# Patient Record
Sex: Female | Born: 1955 | Race: White | Hispanic: No | Marital: Married | Smoking: Never smoker
Health system: Southern US, Community
[De-identification: ages and names within clinical notes are randomized; demographics above are authoritative.]

## PROBLEM LIST (undated history)

## (undated) DIAGNOSIS — N6320 Unspecified lump in the left breast, unspecified quadrant: Secondary | ICD-10-CM

## (undated) DIAGNOSIS — F419 Anxiety disorder, unspecified: Secondary | ICD-10-CM

## (undated) DIAGNOSIS — E039 Hypothyroidism, unspecified: Secondary | ICD-10-CM

## (undated) DIAGNOSIS — K219 Gastro-esophageal reflux disease without esophagitis: Secondary | ICD-10-CM

## (undated) HISTORY — PX: WISDOM TOOTH EXTRACTION: SHX21

---

## 2013-12-04 ENCOUNTER — Other Ambulatory Visit: Payer: Self-pay | Admitting: Surgical Oncology

## 2013-12-04 DIAGNOSIS — N63 Unspecified lump in unspecified breast: Secondary | ICD-10-CM

## 2013-12-04 DIAGNOSIS — R921 Mammographic calcification found on diagnostic imaging of breast: Secondary | ICD-10-CM

## 2013-12-16 ENCOUNTER — Other Ambulatory Visit: Payer: Self-pay | Admitting: Surgical Oncology

## 2013-12-16 DIAGNOSIS — N63 Unspecified lump in unspecified breast: Secondary | ICD-10-CM

## 2013-12-17 ENCOUNTER — Ambulatory Visit
Admission: RE | Admit: 2013-12-17 | Discharge: 2013-12-17 | Disposition: A | Discharge status: Home / Self Care | Payer: BC Managed Care – PPO | Source: Ambulatory Visit | Attending: Surgical Oncology | Admitting: Surgical Oncology

## 2013-12-17 ENCOUNTER — Other Ambulatory Visit: Payer: Self-pay

## 2013-12-17 DIAGNOSIS — R921 Mammographic calcification found on diagnostic imaging of breast: Secondary | ICD-10-CM

## 2013-12-17 DIAGNOSIS — N63 Unspecified lump in unspecified breast: Secondary | ICD-10-CM

## 2014-01-22 ENCOUNTER — Encounter (HOSPITAL_COMMUNITY): Payer: Self-pay | Admitting: Emergency Medicine

## 2014-01-22 ENCOUNTER — Emergency Department (HOSPITAL_COMMUNITY)
Admission: EM | Admit: 2014-01-22 | Discharge: 2014-01-22 | Disposition: A | Discharge status: Home / Self Care | Payer: Worker's Compensation | Attending: Emergency Medicine | Admitting: Emergency Medicine

## 2014-01-22 DIAGNOSIS — S0990XA Unspecified injury of head, initial encounter: Secondary | ICD-10-CM | POA: Insufficient documentation

## 2014-01-22 DIAGNOSIS — W01198A Fall on same level from slipping, tripping and stumbling with subsequent striking against other object, initial encounter: Secondary | ICD-10-CM | POA: Insufficient documentation

## 2014-01-22 DIAGNOSIS — S060X0A Concussion without loss of consciousness, initial encounter: Secondary | ICD-10-CM

## 2014-01-22 DIAGNOSIS — Y9289 Other specified places as the place of occurrence of the external cause: Secondary | ICD-10-CM | POA: Insufficient documentation

## 2014-01-22 DIAGNOSIS — Y9389 Activity, other specified: Secondary | ICD-10-CM | POA: Insufficient documentation

## 2014-01-22 DIAGNOSIS — W19XXXA Unspecified fall, initial encounter: Secondary | ICD-10-CM

## 2014-01-22 MED ORDER — ACETAMINOPHEN 500 MG PO TABS
1000.0000 mg | ORAL_TABLET | Freq: Once | ORAL | Status: AC
  Administered 2014-01-22: 1000 mg via ORAL
  Filled 2014-01-22: qty 2

## 2014-01-22 NOTE — ED Notes (Signed)
Patient coming from work after falling on a slick surface that was being mopped by facility.  Pt states she hit the left side of her head against the wall causing a headache and some numbness and left elbow pain.  Denies LOC.  Patient has bruising to the left elbow but is able to move freely.

## 2014-01-22 NOTE — ED Notes (Signed)
Patient alert and oriented at discharge.  Patient ambulatory to the waiting room with this RN to waiting family.

## 2014-01-22 NOTE — Discharge Instructions (Signed)
Concussion  A concussion, or closed-head injury, is a brain injury caused by a direct blow to the head or by a quick and sudden movement (jolt) of the head or neck. Concussions are usually not life-threatening. Even so, the effects of a concussion can be serious. If you have had a concussion before, you are more likely to experience concussion-like symptoms after a direct blow to the head.   CAUSES  · Direct blow to the head, such as from running into another player during a soccer game, being hit in a fight, or hitting your head on a hard surface.  · A jolt of the head or neck that causes the brain to move back and forth inside the skull, such as in a car crash.  SIGNS AND SYMPTOMS  The signs of a concussion can be hard to notice. Early on, they may be missed by you, family members, and health care providers. You may look fine but act or feel differently.  Symptoms are usually temporary, but they may last for days, weeks, or even longer. Some symptoms may appear right away while others may not show up for hours or days. Every head injury is different. Symptoms include:  · Mild to moderate headaches that will not go away.  · A feeling of pressure inside your head.  · Having more trouble than usual:  ¨ Learning or remembering things you have heard.  ¨ Answering questions.  ¨ Paying attention or concentrating.  ¨ Organizing daily tasks.  ¨ Making decisions and solving problems.  · Slowness in thinking, acting or reacting, speaking, or reading.  · Getting lost or being easily confused.  · Feeling tired all the time or lacking energy (fatigued).  · Feeling drowsy.  · Sleep disturbances.  ¨ Sleeping more than usual.  ¨ Sleeping less than usual.  ¨ Trouble falling asleep.  ¨ Trouble sleeping (insomnia).  · Loss of balance or feeling lightheaded or dizzy.  · Nausea or vomiting.  · Numbness or tingling.  · Increased sensitivity to:  ¨ Sounds.  ¨ Lights.  ¨ Distractions.  · Vision problems or eyes that tire  easily.  · Diminished sense of taste or smell.  · Ringing in the ears.  · Mood changes such as feeling sad or anxious.  · Becoming easily irritated or angry for little or no reason.  · Lack of motivation.  · Seeing or hearing things other people do not see or hear (hallucinations).  DIAGNOSIS  Your health care provider can usually diagnose a concussion based on a description of your injury and symptoms. He or she will ask whether you passed out (lost consciousness) and whether you are having trouble remembering events that happened right before and during your injury.  Your evaluation might include:  · A brain scan to look for signs of injury to the brain. Even if the test shows no injury, you may still have a concussion.  · Blood tests to be sure other problems are not present.  TREATMENT  · Concussions are usually treated in an emergency department, in urgent care, or at a clinic. You may need to stay in the hospital overnight for further treatment.  · Tell your health care provider if you are taking any medicines, including prescription medicines, over-the-counter medicines, and natural remedies. Some medicines, such as blood thinners (anticoagulants) and aspirin, may increase the chance of complications. Also tell your health care provider whether you have had alcohol or are taking illegal drugs. This information   may affect treatment.  · Your health care provider will send you home with important instructions to follow.  · How fast you will recover from a concussion depends on many factors. These factors include how severe your concussion is, what part of your brain was injured, your age, and how healthy you were before the concussion.  · Most people with mild injuries recover fully. Recovery can take time. In general, recovery is slower in older persons. Also, persons who have had a concussion in the past or have other medical problems may find that it takes longer to recover from their current injury.  HOME  CARE INSTRUCTIONS  General Instructions  · Carefully follow the directions your health care provider gave you.  · Only take over-the-counter or prescription medicines for pain, discomfort, or fever as directed by your health care provider.  · Take only those medicines that your health care provider has approved.  · Do not drink alcohol until your health care provider says you are well enough to do so. Alcohol and certain other drugs may slow your recovery and can put you at risk of further injury.  · If it is harder than usual to remember things, write them down.  · If you are easily distracted, try to do one thing at a time. For example, do not try to watch TV while fixing dinner.  · Talk with family members or close friends when making important decisions.  · Keep all follow-up appointments. Repeated evaluation of your symptoms is recommended for your recovery.  · Watch your symptoms and tell others to do the same. Complications sometimes occur after a concussion. Older adults with a brain injury may have a higher risk of serious complications, such as a blood clot on the brain.  · Tell your teachers, school nurse, school counselor, coach, athletic trainer, or work manager about your injury, symptoms, and restrictions. Tell them about what you can or cannot do. They should watch for:  ¨ Increased problems with attention or concentration.  ¨ Increased difficulty remembering or learning new information.  ¨ Increased time needed to complete tasks or assignments.  ¨ Increased irritability or decreased ability to cope with stress.  ¨ Increased symptoms.  · Rest. Rest helps the brain to heal. Make sure you:  ¨ Get plenty of sleep at night. Avoid staying up late at night.  ¨ Keep the same bedtime hours on weekends and weekdays.  ¨ Rest during the day. Take daytime naps or rest breaks when you feel tired.  · Limit activities that require a lot of thought or concentration. These include:  ¨ Doing homework or job-related  work.  ¨ Watching TV.  ¨ Working on the computer.  · Avoid any situation where there is potential for another head injury (football, hockey, soccer, basketball, martial arts, downhill snow sports and horseback riding). Your condition will get worse every time you experience a concussion. You should avoid these activities until you are evaluated by the appropriate follow-up health care providers.  Returning To Your Regular Activities  You will need to return to your normal activities slowly, not all at once. You must give your body and brain enough time for recovery.  · Do not return to sports or other athletic activities until your health care provider tells you it is safe to do so.  · Ask your health care provider when you can drive, ride a bicycle, or operate heavy machinery. Your ability to react may be slower after a   brain injury. Never do these activities if you are dizzy.  · Ask your health care provider about when you can return to work or school.  Preventing Another Concussion  It is very important to avoid another brain injury, especially before you have recovered. In rare cases, another injury can lead to permanent brain damage, brain swelling, or death. The risk of this is greatest during the first 7-10 days after a head injury. Avoid injuries by:  · Wearing a seat belt when riding in a car.  · Drinking alcohol only in moderation.  · Wearing a helmet when biking, skiing, skateboarding, skating, or doing similar activities.  · Avoiding activities that could lead to a second concussion, such as contact or recreational sports, until your health care provider says it is okay.  · Taking safety measures in your home.  ¨ Remove clutter and tripping hazards from floors and stairways.  ¨ Use grab bars in bathrooms and handrails by stairs.  ¨ Place non-slip mats on floors and in bathtubs.  ¨ Improve lighting in dim areas.  SEEK MEDICAL CARE IF:  · You have increased problems paying attention or  concentrating.  · You have increased difficulty remembering or learning new information.  · You need more time to complete tasks or assignments than before.  · You have increased irritability or decreased ability to cope with stress.  · You have more symptoms than before.  Seek medical care if you have any of the following symptoms for more than 2 weeks after your injury:  · Lasting (chronic) headaches.  · Dizziness or balance problems.  · Nausea.  · Vision problems.  · Increased sensitivity to noise or light.  · Depression or mood swings.  · Anxiety or irritability.  · Memory problems.  · Difficulty concentrating or paying attention.  · Sleep problems.  · Feeling tired all the time.  SEEK IMMEDIATE MEDICAL CARE IF:  · You have severe or worsening headaches. These may be a sign of a blood clot in the brain.  · You have weakness (even if only in one hand, leg, or part of the face).  · You have numbness.  · You have decreased coordination.  · You vomit repeatedly.  · You have increased sleepiness.  · One pupil is larger than the other.  · You have convulsions.  · You have slurred speech.  · You have increased confusion. This may be a sign of a blood clot in the brain.  · You have increased restlessness, agitation, or irritability.  · You are unable to recognize people or places.  · You have neck pain.  · It is difficult to wake you up.  · You have unusual behavior changes.  · You lose consciousness.  MAKE SURE YOU:  · Understand these instructions.  · Will watch your condition.  · Will get help right away if you are not doing well or get worse.  Document Released: 05/28/2003 Document Revised: 03/12/2013 Document Reviewed: 09/27/2012  [employer]® Patient Information ©2015 [employer]. This information is not intended to replace advice given to you by your health care provider. Make sure you discuss any questions you have with your health care provider.

## 2014-01-22 NOTE — ED Provider Notes (Signed)
CSN: [id number]     Arrival date & time 01/22/14  0615 History   First MD Initiated Contact with Patient 01/22/14 [phone]     No chief complaint on file.    (Consider location/radiation/quality/duration/timing/severity/associated sxs/prior Treatment) HPI Comments: Patient presents to the emergency department with chief complaint of fall. She states that she slipped on wet tile floor this morning, causing her to fall backward. She states that she hit her head on the wall. She denies any LOC. Denies any weakness, vision changes, numbness, tingling, or vomiting. She has not taken anything to alleviate the symptoms. Fall occurred at approximately 5:30. She states that she feels a little "fuzzy." She states that she suffers from anxiety, and this is made her even more anxious. She tried taking an Ativan earlier this morning for the anxiety.  The history is provided by the patient. No language interpreter was used.    No past medical history on file. No past surgical history on file. No family history on file. History  Substance Use Topics  . Smoking status: Not on file  . Smokeless tobacco: Not on file  . Alcohol Use: Not on file   OB History    No data available     Review of Systems  Constitutional: Negative for fever and chills.  Respiratory: Negative for shortness of breath.   Cardiovascular: Negative for chest pain.  Gastrointestinal: Negative for nausea, vomiting, diarrhea and constipation.  Genitourinary: Negative for dysuria.  Neurological: Positive for headaches.  All other systems reviewed and are negative.     Allergies  Review of patient's allergies indicates no known allergies.  Home Medications   Prior to Admission medications   Not on File   BP 117/104 mmHg  Pulse 85  Temp(Src) 97.9 F (36.6 C) (Oral)  Resp 28  SpO2 100% Physical Exam  Constitutional: She is oriented to person, place, and time. She appears well-developed and well-nourished.  HENT:  Head:  Normocephalic and atraumatic.  Right Ear: External ear normal.  Left Ear: External ear normal.  No Battle's sign, no raccoon eyes, no scalp hematoma  Eyes: Conjunctivae and EOM are normal. Pupils are equal, round, and reactive to light.  Neck: Normal range of motion. Neck supple.  No pain with neck flexion, no meningismus  Cardiovascular: Normal rate, regular rhythm and normal heart sounds.  Exam reveals no gallop and no friction rub.   No murmur heard. Pulmonary/Chest: Effort normal and breath sounds normal. No respiratory distress. She has no wheezes. She has no rales. She exhibits no tenderness.  Abdominal: Soft. She exhibits no distension and no mass. There is no tenderness. There is no rebound and no guarding.  Musculoskeletal: Normal range of motion. She exhibits no edema or tenderness.  Normal gait.  Neurological: She is alert and oriented to person, place, and time. She has normal reflexes.  CN 3-12 intact,speech is clear, movements are goal oriented, normal finger to nose, no pronator drift, sensation and strength intact bilaterally.  Skin: Skin is warm and dry.  Psychiatric: She has a normal mood and affect. Her behavior is normal. Judgment and thought content normal.  Nursing note and vitals reviewed.   ED Course  Procedures (including critical care time) Labs Review Labs Reviewed - No data to display  Imaging Review No results found.   EKG Interpretation None      MDM   Final diagnoses:  Fall, initial encounter  Head injury, initial encounter  Concussion, without loss of consciousness, initial encounter  Patient with mechanical fall. She struck her head on the wall. There are no signs of severe mechanism of injury, or trauma. She complains of slight headache, and slight fuzzy headed feeling. She denies any vomiting. Currently she clears [place] head CT rules. I will observe her for one hour to ensure that there is no vomiting, and that her GCS remains at 15.  Will treat with Tylenol. Will fluid challenge. Patient is neurovascularly intact.  7:27 AM Patient reassessed. She states that she still has slight headache, but otherwise feels well. Fluid challenge, and ambulate. Plan for discharge to home. Neuro exam repeated, patient is neurovascularly intact.    Roxy Horsemanobert Chancy Smigiel, PA-C 01/22/14 [phone]  Dione Boozeavid Glick, MD 01/22/14 [phone]

## 2014-01-22 NOTE — ED Notes (Signed)
Patient is drinking  A sprite.  Stated she was fine just a headache.

## 2016-01-06 ENCOUNTER — Other Ambulatory Visit: Payer: Self-pay | Admitting: Internal Medicine

## 2016-01-06 DIAGNOSIS — N6489 Other specified disorders of breast: Secondary | ICD-10-CM

## 2016-01-15 ENCOUNTER — Ambulatory Visit
Admission: RE | Admit: 2016-01-15 | Discharge: 2016-01-15 | Disposition: A | Discharge status: Home / Self Care | Payer: 59 | Source: Ambulatory Visit | Attending: Internal Medicine | Admitting: Internal Medicine

## 2016-01-15 DIAGNOSIS — N6489 Other specified disorders of breast: Secondary | ICD-10-CM

## 2016-02-09 ENCOUNTER — Ambulatory Visit: Payer: Self-pay | Admitting: General Surgery

## 2016-02-09 ENCOUNTER — Other Ambulatory Visit: Payer: Self-pay | Admitting: General Surgery

## 2016-02-09 DIAGNOSIS — N6022 Fibroadenosis of left breast: Secondary | ICD-10-CM

## 2016-02-19 ENCOUNTER — Ambulatory Visit: Payer: Self-pay | Admitting: General Surgery

## 2016-02-19 DIAGNOSIS — N6022 Fibroadenosis of left breast: Secondary | ICD-10-CM

## 2016-02-29 ENCOUNTER — Encounter (HOSPITAL_BASED_OUTPATIENT_CLINIC_OR_DEPARTMENT_OTHER): Payer: Self-pay | Admitting: *Deleted

## 2016-03-02 ENCOUNTER — Ambulatory Visit
Admission: RE | Admit: 2016-03-02 | Discharge: 2016-03-02 | Disposition: A | Discharge status: Home / Self Care | Payer: 59 | Source: Ambulatory Visit | Attending: General Surgery | Admitting: General Surgery

## 2016-03-02 DIAGNOSIS — N6022 Fibroadenosis of left breast: Secondary | ICD-10-CM

## 2016-03-02 NOTE — Progress Notes (Signed)
Boost drink given to pt. With instructions to have completed by 0430 hrs.  NPO otherwise after midnight. Pt verbalized understanding.

## 2016-03-04 ENCOUNTER — Ambulatory Visit (HOSPITAL_BASED_OUTPATIENT_CLINIC_OR_DEPARTMENT_OTHER): Payer: 59 | Admitting: Anesthesiology

## 2016-03-04 ENCOUNTER — Encounter (HOSPITAL_BASED_OUTPATIENT_CLINIC_OR_DEPARTMENT_OTHER)
Admission: RE | Disposition: A | Discharge status: Home / Self Care | Payer: Self-pay | Source: Ambulatory Visit | Attending: General Surgery

## 2016-03-04 ENCOUNTER — Ambulatory Visit
Admission: RE | Admit: 2016-03-04 | Discharge: 2016-03-04 | Disposition: A | Discharge status: Home / Self Care | Payer: 59 | Source: Ambulatory Visit | Attending: General Surgery | Admitting: General Surgery

## 2016-03-04 ENCOUNTER — Ambulatory Visit (HOSPITAL_BASED_OUTPATIENT_CLINIC_OR_DEPARTMENT_OTHER)
Admission: RE | Admit: 2016-03-04 | Discharge: 2016-03-04 | Disposition: A | Discharge status: Home / Self Care | Payer: 59 | Source: Ambulatory Visit | Attending: General Surgery | Admitting: General Surgery

## 2016-03-04 ENCOUNTER — Encounter (HOSPITAL_BASED_OUTPATIENT_CLINIC_OR_DEPARTMENT_OTHER): Payer: Self-pay | Admitting: *Deleted

## 2016-03-04 DIAGNOSIS — Z79899 Other long term (current) drug therapy: Secondary | ICD-10-CM | POA: Insufficient documentation

## 2016-03-04 DIAGNOSIS — N6489 Other specified disorders of breast: Secondary | ICD-10-CM | POA: Diagnosis present

## 2016-03-04 DIAGNOSIS — E039 Hypothyroidism, unspecified: Secondary | ICD-10-CM | POA: Diagnosis not present

## 2016-03-04 DIAGNOSIS — K219 Gastro-esophageal reflux disease without esophagitis: Secondary | ICD-10-CM | POA: Insufficient documentation

## 2016-03-04 DIAGNOSIS — N6022 Fibroadenosis of left breast: Secondary | ICD-10-CM

## 2016-03-04 DIAGNOSIS — Z803 Family history of malignant neoplasm of breast: Secondary | ICD-10-CM | POA: Diagnosis not present

## 2016-03-04 HISTORY — DX: Gastro-esophageal reflux disease without esophagitis: K21.9

## 2016-03-04 HISTORY — PX: BREAST LUMPECTOMY WITH RADIOACTIVE SEED LOCALIZATION: SHX6424

## 2016-03-04 HISTORY — DX: Anxiety disorder, unspecified: F41.9

## 2016-03-04 HISTORY — DX: Unspecified lump in the left breast, unspecified quadrant: N63.20

## 2016-03-04 HISTORY — DX: Hypothyroidism, unspecified: E03.9

## 2016-03-04 SURGERY — BREAST LUMPECTOMY WITH RADIOACTIVE SEED LOCALIZATION
Anesthesia: General | Site: Breast | Laterality: Left

## 2016-03-04 MED ORDER — OXYCODONE HCL 5 MG/5ML PO SOLN: 5.0000 mg | Freq: Once | ORAL | Status: DC | PRN

## 2016-03-04 MED ORDER — MIDAZOLAM HCL 2 MG/2ML IJ SOLN
INTRAMUSCULAR | Status: AC
  Filled 2016-03-04: qty 2

## 2016-03-04 MED ORDER — MEPERIDINE HCL 25 MG/ML IJ SOLN: 6.2500 mg | INTRAMUSCULAR | Status: DC | PRN

## 2016-03-04 MED ORDER — SCOPOLAMINE 1 MG/3DAYS TD PT72
1.0000 | MEDICATED_PATCH | Freq: Once | TRANSDERMAL | Status: AC | PRN
  Administered 2016-03-04: 1 via TRANSDERMAL

## 2016-03-04 MED ORDER — BUPIVACAINE-EPINEPHRINE (PF) 0.25% -1:200000 IJ SOLN
INTRAMUSCULAR | Status: AC
  Filled 2016-03-04: qty 30

## 2016-03-04 MED ORDER — CHLORHEXIDINE GLUCONATE CLOTH 2 % EX PADS: 6.0000 | MEDICATED_PAD | Freq: Once | CUTANEOUS | Status: DC

## 2016-03-04 MED ORDER — PROPOFOL 10 MG/ML IV BOLUS
INTRAVENOUS | Status: DC | PRN
  Administered 2016-03-04: 150 mg via INTRAVENOUS
  Administered 2016-03-04: 20 mg via INTRAVENOUS

## 2016-03-04 MED ORDER — MIDAZOLAM HCL 2 MG/2ML IJ SOLN
1.0000 mg | INTRAMUSCULAR | Status: DC | PRN
  Administered 2016-03-04: 2 mg via INTRAVENOUS

## 2016-03-04 MED ORDER — FENTANYL CITRATE (PF) 100 MCG/2ML IJ SOLN
INTRAMUSCULAR | Status: AC
  Filled 2016-03-04: qty 2

## 2016-03-04 MED ORDER — BUPIVACAINE HCL (PF) 0.25 % IJ SOLN
INTRAMUSCULAR | Status: AC
  Filled 2016-03-04: qty 30

## 2016-03-04 MED ORDER — CEFAZOLIN SODIUM-DEXTROSE 2-4 GM/100ML-% IV SOLN: 2.0000 g | INTRAVENOUS | Status: DC

## 2016-03-04 MED ORDER — LIDOCAINE 2% (20 MG/ML) 5 ML SYRINGE
INTRAMUSCULAR | Status: AC
  Filled 2016-03-04: qty 5

## 2016-03-04 MED ORDER — DEXAMETHASONE SODIUM PHOSPHATE 10 MG/ML IJ SOLN
INTRAMUSCULAR | Status: AC
  Filled 2016-03-04: qty 1

## 2016-03-04 MED ORDER — LACTATED RINGERS IV SOLN
INTRAVENOUS | Status: DC
  Administered 2016-03-04: 08:00:00 via INTRAVENOUS
  Administered 2016-03-04: 10 mL/h via INTRAVENOUS

## 2016-03-04 MED ORDER — ONDANSETRON HCL 4 MG/2ML IJ SOLN
INTRAMUSCULAR | Status: AC
  Filled 2016-03-04: qty 2

## 2016-03-04 MED ORDER — PROMETHAZINE HCL 25 MG/ML IJ SOLN: 6.2500 mg | INTRAMUSCULAR | Status: DC | PRN

## 2016-03-04 MED ORDER — HYDROCODONE-ACETAMINOPHEN 5-325 MG PO TABS: 1.0000 | ORAL_TABLET | ORAL | 0 refills | Status: AC | PRN

## 2016-03-04 MED ORDER — BUPIVACAINE HCL (PF) 0.25 % IJ SOLN
INTRAMUSCULAR | Status: DC | PRN
  Administered 2016-03-04: 20 mL

## 2016-03-04 MED ORDER — FENTANYL CITRATE (PF) 100 MCG/2ML IJ SOLN
50.0000 ug | INTRAMUSCULAR | Status: DC | PRN
  Administered 2016-03-04: 50 ug via INTRAVENOUS

## 2016-03-04 MED ORDER — OXYCODONE HCL 5 MG PO TABS: 5.0000 mg | ORAL_TABLET | Freq: Once | ORAL | Status: DC | PRN

## 2016-03-04 MED ORDER — CEFAZOLIN SODIUM-DEXTROSE 2-4 GM/100ML-% IV SOLN
INTRAVENOUS | Status: AC
  Filled 2016-03-04: qty 100

## 2016-03-04 MED ORDER — ONDANSETRON 4 MG PO TBDP
4.0000 mg | ORAL_TABLET | Freq: Once | ORAL | Status: AC
  Administered 2016-03-04: 4 mg via ORAL

## 2016-03-04 MED ORDER — CEFAZOLIN SODIUM-DEXTROSE 2-4 GM/100ML-% IV SOLN
2.0000 g | INTRAVENOUS | Status: AC
  Administered 2016-03-04: 2 g via INTRAVENOUS

## 2016-03-04 MED ORDER — ONDANSETRON 4 MG PO TBDP
ORAL_TABLET | ORAL | Status: AC
  Filled 2016-03-04: qty 1

## 2016-03-04 MED ORDER — HYDROMORPHONE HCL 1 MG/ML IJ SOLN: 0.2500 mg | INTRAMUSCULAR | Status: DC | PRN

## 2016-03-04 MED ORDER — LIDOCAINE 2% (20 MG/ML) 5 ML SYRINGE
INTRAMUSCULAR | Status: DC | PRN
  Administered 2016-03-04: 100 mg via INTRAVENOUS

## 2016-03-04 MED ORDER — DEXAMETHASONE SODIUM PHOSPHATE 4 MG/ML IJ SOLN
INTRAMUSCULAR | Status: DC | PRN
  Administered 2016-03-04: 10 mg via INTRAVENOUS

## 2016-03-04 MED ORDER — SCOPOLAMINE 1 MG/3DAYS TD PT72
MEDICATED_PATCH | TRANSDERMAL | Status: AC
  Filled 2016-03-04: qty 1

## 2016-03-04 MED ORDER — EPHEDRINE SULFATE-NACL 50-0.9 MG/10ML-% IV SOSY
PREFILLED_SYRINGE | INTRAVENOUS | Status: DC | PRN
  Administered 2016-03-04 (×2): 10 mg via INTRAVENOUS

## 2016-03-04 SURGICAL SUPPLY — 42 items
APPLIER CLIP 9.375 MED OPEN (MISCELLANEOUS)
BLADE SURG 15 STRL LF DISP TIS (BLADE) ×1 IMPLANT
BLADE SURG 15 STRL SS (BLADE) ×1
CANISTER SUC SOCK COL 7IN (MISCELLANEOUS) IMPLANT
CANISTER SUCT 1200ML W/VALVE (MISCELLANEOUS) ×2 IMPLANT
CHLORAPREP W/TINT 26ML (MISCELLANEOUS) ×2 IMPLANT
CLIP APPLIE 9.375 MED OPEN (MISCELLANEOUS) IMPLANT
COVER BACK TABLE 60X90IN (DRAPES) ×2 IMPLANT
COVER MAYO STAND STRL (DRAPES) ×2 IMPLANT
COVER PROBE W GEL 5X96 (DRAPES) ×2 IMPLANT
DECANTER SPIKE VIAL GLASS SM (MISCELLANEOUS) IMPLANT
DERMABOND ADVANCED (GAUZE/BANDAGES/DRESSINGS) ×1
DERMABOND ADVANCED .7 DNX12 (GAUZE/BANDAGES/DRESSINGS) ×1 IMPLANT
DEVICE DUBIN W/COMP PLATE 8390 (MISCELLANEOUS) ×2 IMPLANT
DRAPE LAPAROSCOPIC ABDOMINAL (DRAPES) ×2 IMPLANT
DRAPE UTILITY XL STRL (DRAPES) ×2 IMPLANT
ELECT COATED BLADE 2.86 ST (ELECTRODE) ×2 IMPLANT
ELECT REM PT RETURN 9FT ADLT (ELECTROSURGICAL) ×2
ELECTRODE REM PT RTRN 9FT ADLT (ELECTROSURGICAL) ×1 IMPLANT
GLOVE BIO SURGEON STRL SZ7.5 (GLOVE) ×4 IMPLANT
GLOVE BIOGEL PI IND STRL 7.0 (GLOVE) ×1 IMPLANT
GLOVE BIOGEL PI INDICATOR 7.0 (GLOVE) ×1
GLOVE SURG SS PI 6.5 STRL IVOR (GLOVE) ×2 IMPLANT
GOWN STRL REUS W/ TWL LRG LVL3 (GOWN DISPOSABLE) ×2 IMPLANT
GOWN STRL REUS W/TWL LRG LVL3 (GOWN DISPOSABLE) ×2
ILLUMINATOR WAVEGUIDE N/F (MISCELLANEOUS) IMPLANT
KIT MARKER MARGIN INK (KITS) ×2 IMPLANT
LIGHT WAVEGUIDE WIDE FLAT (MISCELLANEOUS) ×2 IMPLANT
NEEDLE HYPO 25X1 1.5 SAFETY (NEEDLE) ×2 IMPLANT
NS IRRIG 1000ML POUR BTL (IV SOLUTION) ×2 IMPLANT
PACK BASIN DAY SURGERY FS (CUSTOM PROCEDURE TRAY) ×2 IMPLANT
PENCIL BUTTON HOLSTER BLD 10FT (ELECTRODE) ×2 IMPLANT
SLEEVE SCD COMPRESS KNEE MED (MISCELLANEOUS) ×2 IMPLANT
SPONGE LAP 18X18 X RAY DECT (DISPOSABLE) ×2 IMPLANT
SUT MON AB 4-0 PC3 18 (SUTURE) ×2 IMPLANT
SUT SILK 2 0 SH (SUTURE) IMPLANT
SUT VICRYL 3-0 CR8 SH (SUTURE) ×2 IMPLANT
SYR CONTROL 10ML LL (SYRINGE) ×2 IMPLANT
TOWEL OR 17X24 6PK STRL BLUE (TOWEL DISPOSABLE) ×2 IMPLANT
TOWEL OR NON WOVEN STRL DISP B (DISPOSABLE) IMPLANT
TUBE CONNECTING 20X1/4 (TUBING) ×2 IMPLANT
YANKAUER SUCT BULB TIP NO VENT (SUCTIONS) ×2 IMPLANT

## 2016-03-04 NOTE — Interval H&P Note (Signed)
History and Physical Interval Note:  03/04/2016 7:26 AM  Joann Murphy  has presented today for surgery, with the diagnosis of LEFT BREAST COMPLEX SCLEROSING LESION  The various methods of treatment have been discussed with the patient and family. After consideration of risks, benefits and other options for treatment, the patient has consented to  Procedure(s): LEFT BREAST LUMPECTOMY WITH RADIOACTIVE SEED LOCALIZATION (Left) as a surgical intervention .  The patient's history has been reviewed, patient examined, no change in status, stable for surgery.  I have reviewed the patient's chart and labs.  Questions were answered to the patient's satisfaction.     TOTH III,PAUL S

## 2016-03-04 NOTE — Anesthesia Procedure Notes (Signed)
Procedure Name: LMA Insertion Date/Time: 03/04/2016 7:46 AM Performed by: Gar GibbonKEETON, Rayyan Orsborn S Pre-anesthesia Checklist: Patient identified, Emergency Drugs available, Suction available and Patient being monitored Patient Re-evaluated:Patient Re-evaluated prior to inductionOxygen Delivery Method: Circle system utilized Preoxygenation: Pre-oxygenation with 100% oxygen Intubation Type: IV induction Ventilation: Mask ventilation without difficulty LMA: LMA inserted LMA Size: 3.0 Number of attempts: 1 Airway Equipment and Method: Bite block Placement Confirmation: positive ETCO2 Tube secured with: Tape Dental Injury: Teeth and Oropharynx as per pre-operative assessment

## 2016-03-04 NOTE — Anesthesia Postprocedure Evaluation (Signed)
Anesthesia Post Note  Patient: Joann MunroRebecca Murphy  Procedure(s) Performed: Procedure(s) (LRB): LEFT BREAST LUMPECTOMY WITH RADIOACTIVE SEED LOCALIZATION (Left)  Patient location during evaluation: PACU Anesthesia Type: General Level of consciousness: sedated and patient cooperative Pain management: pain level controlled Vital Signs Assessment: post-procedure vital signs reviewed and stable Respiratory status: spontaneous breathing Cardiovascular status: stable Anesthetic complications: no    Last Vitals:  Vitals:   03/04/16 0915 03/04/16 0930  BP: 103/69 101/63  Pulse: 76 83  Resp: 11 17  Temp:      Last Pain:  Vitals:   03/04/16 0915  TempSrc:   PainSc: 0-No pain                 Lewie LoronJohn Darrelyn Morro

## 2016-03-04 NOTE — Anesthesia Preprocedure Evaluation (Signed)
Anesthesia Evaluation  Patient identified by MRN, date of birth, ID band Patient awake    Reviewed: Allergy & Precautions, NPO status , Patient's Chart, lab work & pertinent test results  Airway Mallampati: II  TM Distance: >3 FB Neck ROM: Full    Dental no notable dental hx.    Pulmonary neg pulmonary ROS,    Pulmonary exam normal breath sounds clear to auscultation       Cardiovascular negative cardio ROS Normal cardiovascular exam Rhythm:Regular Rate:Normal     Neuro/Psych negative neurological ROS  negative psych ROS   GI/Hepatic Neg liver ROS, GERD  ,  Endo/Other  Hypothyroidism   Renal/GU negative Renal ROS     Musculoskeletal negative musculoskeletal ROS (+)   Abdominal   Peds  Hematology negative hematology ROS (+)   Anesthesia Other Findings   Reproductive/Obstetrics negative OB ROS                             Anesthesia Physical Anesthesia Plan  ASA: II  Anesthesia Plan: General   Post-op Pain Management:    Induction: Intravenous  Airway Management Planned: LMA  Additional Equipment:   Intra-op Plan:   Post-operative Plan: Extubation in OR  Informed Consent: I have reviewed the patients History and Physical, chart, labs and discussed the procedure including the risks, benefits and alternatives for the proposed anesthesia with the patient or authorized representative who has indicated his/her understanding and acceptance.   Dental advisory given  Plan Discussed with: CRNA  Anesthesia Plan Comments:         Anesthesia Quick Evaluation

## 2016-03-04 NOTE — Op Note (Signed)
03/04/2016  8:39 AM  PATIENT:  Joann Murphy  60 y.o. female  PRE-OPERATIVE DIAGNOSIS:  LEFT BREAST COMPLEX SCLEROSING LESION  POST-OPERATIVE DIAGNOSIS:  LEFT BREAST COMPLEX SCLEROSING LESION  PROCEDURE:  Procedure(s): LEFT BREAST LUMPECTOMY WITH RADIOACTIVE SEED LOCALIZATION (Left)  SURGEON:  Surgeon(s) and Role:    * Griselda MinerPaul Toth III, MD - Primary  PHYSICIAN ASSISTANT:   ASSISTANTS: none   ANESTHESIA:   local and general  EBL:  Total I/O In: 1000 [I.V.:1000] Out: -   BLOOD ADMINISTERED:none  DRAINS: none   LOCAL MEDICATIONS USED:  MARCAINE     SPECIMEN:  Source of Specimen:  left breast tissue  DISPOSITION OF SPECIMEN:  PATHOLOGY  COUNTS:  YES  TOURNIQUET:  * No tourniquets in log *  DICTATION: .Dragon Dictation   After informed consent was obtained the patient was brought to the operating room and placed in the supine position on the operating room table. After adequate induction of general anesthesia the patient's left breast was prepped with ChloraPrep, allowed to dry, and draped in usual sterile manner. An appropriate timeout was performed. Previously an I-125 seed was placed in the upper outer quadrant of the left breast to mark an area of a complex sclerosing lesion. The neoprobe was set to I-125 in the area of radioactivity was readily identified. A curvilinear incision was made with a 15 blade knife along the upper outer edge of the areola. Incision was carried through the skin and subcutaneous tissue sharply with electrocautery. I then directed dissection into the upper outer quadrant of the left breast under the direction of the neoprobe. Once we approached the radioactive seed then I removed a circular portion of breast tissue around the radioactive seed sharply with the electrocautery while checking the area of radioactivity frequently. Once the specimen was removed it was oriented with the appropriate paint colors. A specimen radiograph was obtained that showed  the clip and seed to be within the specimen. The specimen was then sent to pathology for further evaluation. The wound was irrigated with saline and infiltrated with quarter percent Marcaine. Hemostasis was achieved using the Bovie electrocautery. The deep layer of the wound was then closed with layers of interrupted 3-0 Vicryl stitches. The skin was then closed with interrupted 4-0 Monocryl subcuticular stitches. Dermabond dressings were applied. The patient tolerated the procedure well. At the end of the case all needle sponge and instrument counts were correct. The patient was then awakened and taken to recovery in stable condition.  PLAN OF CARE: Discharge to home after PACU  PATIENT DISPOSITION:  PACU - hemodynamically stable.   Delay start of Pharmacological VTE agent (>24hrs) due to surgical blood loss or risk of bleeding: not applicable

## 2016-03-04 NOTE — Transfer of Care (Signed)
Immediate Anesthesia Transfer of Care Note  Patient: Joann MunroRebecca Murphy  Procedure(s) Performed: Procedure(s): LEFT BREAST LUMPECTOMY WITH RADIOACTIVE SEED LOCALIZATION (Left)  Patient Location: PACU  Anesthesia Type:General  Level of Consciousness: awake, sedated and patient cooperative  Airway & Oxygen Therapy: Patient Spontanous Breathing and Patient connected to face mask oxygen  Post-op Assessment: Report given to RN and Post -op Vital signs reviewed and stable  Post vital signs: Reviewed and stable  Last Vitals:  Vitals:   03/04/16 0640  BP: 110/67  Pulse: 77  Resp: 18  Temp: 36.6 C    Last Pain:  Vitals:   03/04/16 0640  TempSrc: Oral         Complications: No apparent anesthesia complications

## 2016-03-04 NOTE — Discharge Instructions (Signed)

## 2016-03-04 NOTE — H&P (Signed)
Joann Murphy  [facility] Patient #: [id number] DOB: 04/09/1955 Married / Language: English / Race: White Female   History of Present Illness  The patient is a 60 year old female who presents with a breast mass. We are asked to see the patient in consultation by Dr. Tilford PillarWynne Woodyear to evaluate her for a complex sclerosing lesion of the lateral left breast. The patient is a 60 year old white female who was found to have a abnormality in the lateral left breast about 6 months ago. It was biopsied and found to be benign. She came in for a 6 month follow-up where the area is still prominent. There was no ultrasound correlate. She denies any breast pain or discharge from the nipple. The area was biopsied and found to have a complex sclerosing lesion. Her only family history of breast cancer is in a paternal aunt at the age of 59. She has also had an extensive workup in [facility] and was found to have liver cysts and a possible hemangioma. She does not take any hormone replacement. She does not smoke.   Other Problems Thyroid Disease   Past Surgical History  Oral Surgery   Diagnostic Studies History  Colonoscopy  never Mammogram  within last year Pap Smear  1-5 years ago  Allergies LevoFLOXacin *CHEMICALS*  Muscle weakness. Septra *ANTI-INFECTIVE AGENTS - MISC.*  Rash.  Medication History  LORazepam (0.5MG  Tablet, Oral daily) Active. Levothyroxine Sodium (100MCG Tablet, Oral daily) Active. Omeprazole (40MG  Capsule DR, Oral as needed) Active. Medications Reconciled  Social History  Caffeine use  Carbonated beverages, Tea. No alcohol use  No drug use  Tobacco use  Never smoker.  Family History Diabetes Mellitus  Mother. Thyroid problems  Mother.  Pregnancy / Birth History  Age at menarche  12 years. Age of menopause  4651-55 Gravida  2 Maternal age  59-35 Para  1    Review of Systems General Not Present- Appetite  Loss, Chills, Fatigue, Fever, Night Sweats, Weight Gain and Weight Loss. Skin Not Present- Change in Wart/Mole, Dryness, Hives, Jaundice, New Lesions, Non-Healing Wounds, Rash and Ulcer. HEENT Present- Wears glasses/contact lenses. Not Present- Earache, Hearing Loss, Hoarseness, Nose Bleed, Oral Ulcers, Ringing in the Ears, Seasonal Allergies, Sinus Pain, Sore Throat, Visual Disturbances and Yellow Eyes. Respiratory Not Present- Bloody sputum, Chronic Cough, Difficulty Breathing, Snoring and Wheezing. Breast Not Present- Breast Mass, Breast Pain, Nipple Discharge and Skin Changes. Cardiovascular Not Present- Chest Pain, Difficulty Breathing Lying Down, Leg Cramps, Palpitations, Rapid Heart Rate, Shortness of Breath and Swelling of Extremities. Gastrointestinal Not Present- Abdominal Pain, Bloating, Bloody Stool, Change in Bowel Habits, Chronic diarrhea, Constipation, Difficulty Swallowing, Excessive gas, Gets full quickly at meals, Hemorrhoids, Indigestion, Nausea, Rectal Pain and Vomiting. Female Genitourinary Present- Urgency. Not Present- Frequency, Nocturia, Painful Urination and Pelvic Pain. Musculoskeletal Not Present- Back Pain, Joint Pain, Joint Stiffness, Muscle Pain, Muscle Weakness and Swelling of Extremities. Neurological Not Present- Decreased Memory, Fainting, Headaches, Numbness, Seizures, Tingling, Tremor, Trouble walking and Weakness. Psychiatric Not Present- Anxiety, Bipolar, Change in Sleep Pattern, Depression, Fearful and Frequent crying. Endocrine Not Present- Cold Intolerance, Excessive Hunger, Hair Changes, Heat Intolerance, Hot flashes and New Diabetes. Hematology Not Present- Blood Thinners, Easy Bruising, Excessive bleeding, Gland problems, HIV and Persistent Infections.  Vitals Weight: 157.8 lb Height: 67in Body Surface Area: 1.83 m Body Mass Index: 24.71 kg/m  Temp.: 42F  Pulse: 72 (Regular)  BP: 118/62 (Sitting, Left Arm, Standard)       Physical  Exam  General Mental Status-Alert. General Appearance-Consistent with stated age. Hydration-Well hydrated. Voice-Normal.  Head and Neck Head-normocephalic, atraumatic with no lesions or palpable masses. Trachea-midline. Thyroid Gland Characteristics - normal size and consistency. Note: There are some palpable nodules of the right thyroid lobe   Eye Eyeball - Bilateral-Extraocular movements intact. Sclera/Conjunctiva - Bilateral-No scleral icterus.  Chest and Lung Exam Chest and lung exam reveals -quiet, even and easy respiratory effort with no use of accessory muscles and on auscultation, normal breath sounds, no adventitious sounds and normal vocal resonance. Inspection Chest Wall - Normal. Back - normal.  Breast Note: There is no palpable mass in either breast. There is no palpable axillary, supraclavicular, or cervical lymphadenopathy. She does have some nodules of her right thyroid lobe.   Cardiovascular Cardiovascular examination reveals -normal heart sounds, regular rate and rhythm with no murmurs and normal pedal pulses bilaterally.  Abdomen Inspection Inspection of the abdomen reveals - No Hernias. Skin - Scar - no surgical scars. Palpation/Percussion Palpation and Percussion of the abdomen reveal - Soft, Non Tender, No Rebound tenderness, No Rigidity (guarding) and No hepatosplenomegaly. Auscultation Auscultation of the abdomen reveals - Bowel sounds normal.  Neurologic Neurologic evaluation reveals -alert and oriented x 3 with no impairment of recent or remote memory. Mental Status-Normal.  Musculoskeletal Normal Exam - Left-Upper Extremity Strength Normal and Lower Extremity Strength Normal. Normal Exam - Right-Upper Extremity Strength Normal and Lower Extremity Strength Normal.  Lymphatic Head & Neck  General Head & Neck Lymphatics: Bilateral - Description - Normal. Axillary  General Axillary Region: Bilateral -  Description - Normal. Tenderness - Non Tender. Femoral & Inguinal  Generalized Femoral & Inguinal Lymphatics: Bilateral - Description - Normal. Tenderness - Non Tender.     SCLEROSING ADENOSIS OF BREAST, LEFT (N60.22) Impression: The patient appears to have a complex sclerosing lesion in the outer left breast. Because of its abnormal appearance and because it can be considered a high risk lesion I would recommend that this area be removed. She would also like to have this done. I have discussed with her in detail the risks and benefits of the operation to do this as well as some of the technical aspects and she understands and wishes to proceed. I will plan for a left breast radioactive seed localized lumpectomy Current Plans Referred to Oncology, for evaluation and follow up (Oncology). Routine. Pt Education - Breast Diseases: discussed with patient and provided information.

## 2016-03-07 ENCOUNTER — Encounter (HOSPITAL_BASED_OUTPATIENT_CLINIC_OR_DEPARTMENT_OTHER): Payer: Self-pay | Admitting: General Surgery

## 2017-02-18 IMAGING — MG MM BREAST LOCALIZATION CLIP
6 series · 6 of 14 positions shown · non-contrast
Comparison: Previous exam(s).

CLINICAL DATA: Status post stereotactic biopsy earlier today for
architectural distortion within the left breast.

EXAM:
DIAGNOSTIC LEFT MAMMOGRAM POST STEREOTACTIC BIOPSY

[L ML synth-2D]
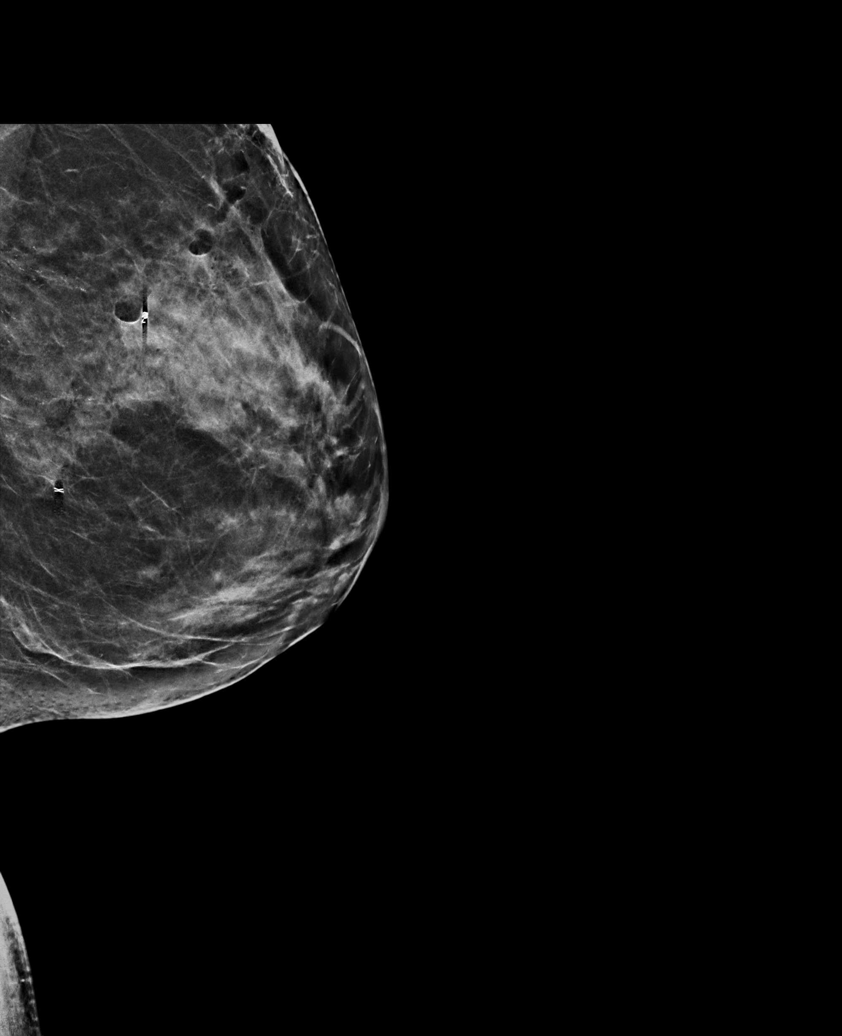

[L CC]
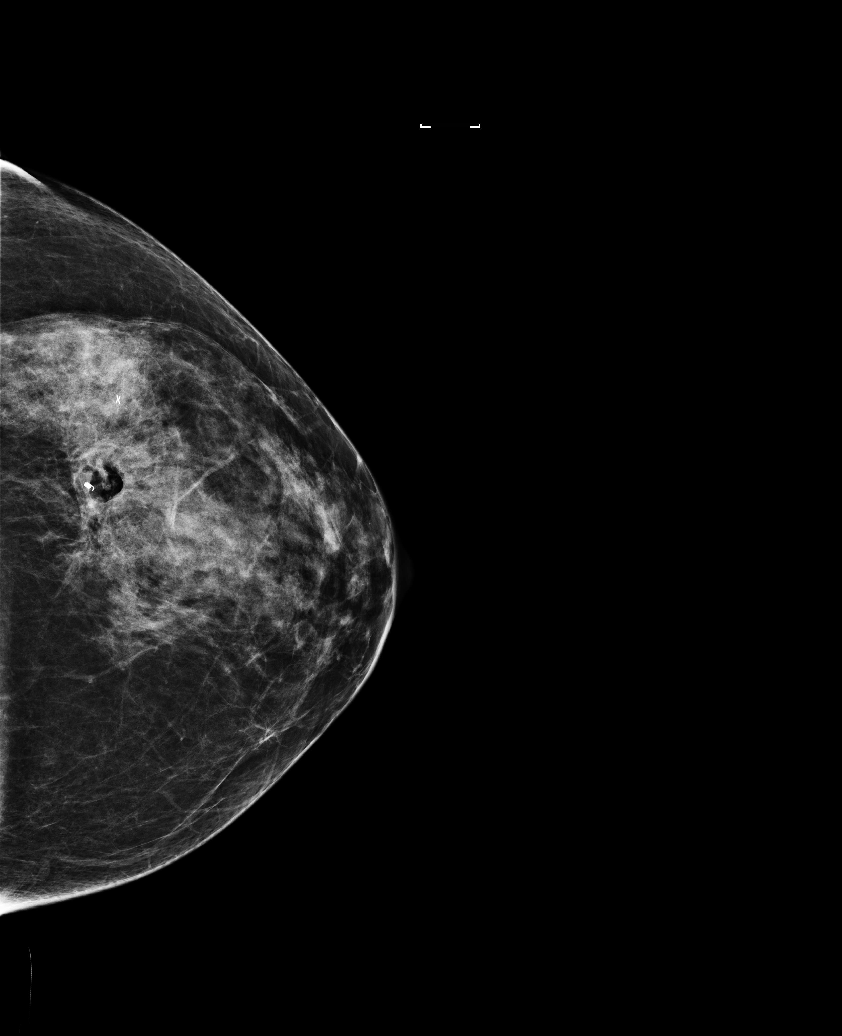

[L CC synth-2D]
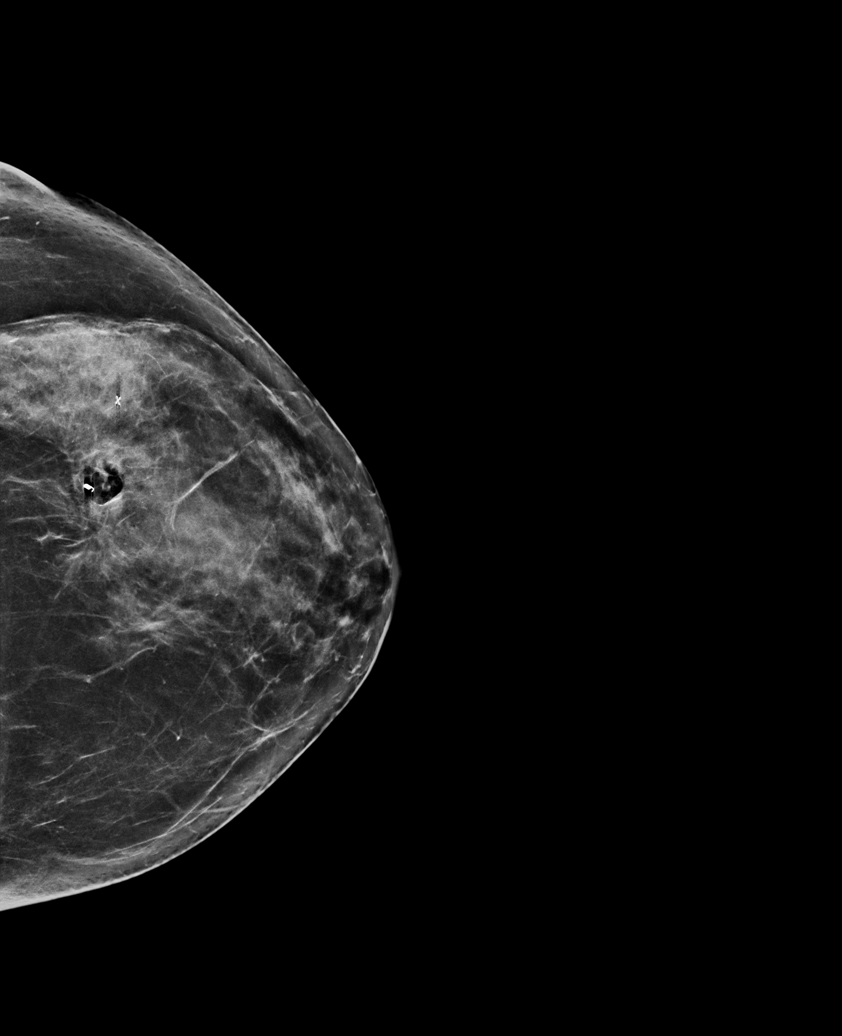

[L ML]
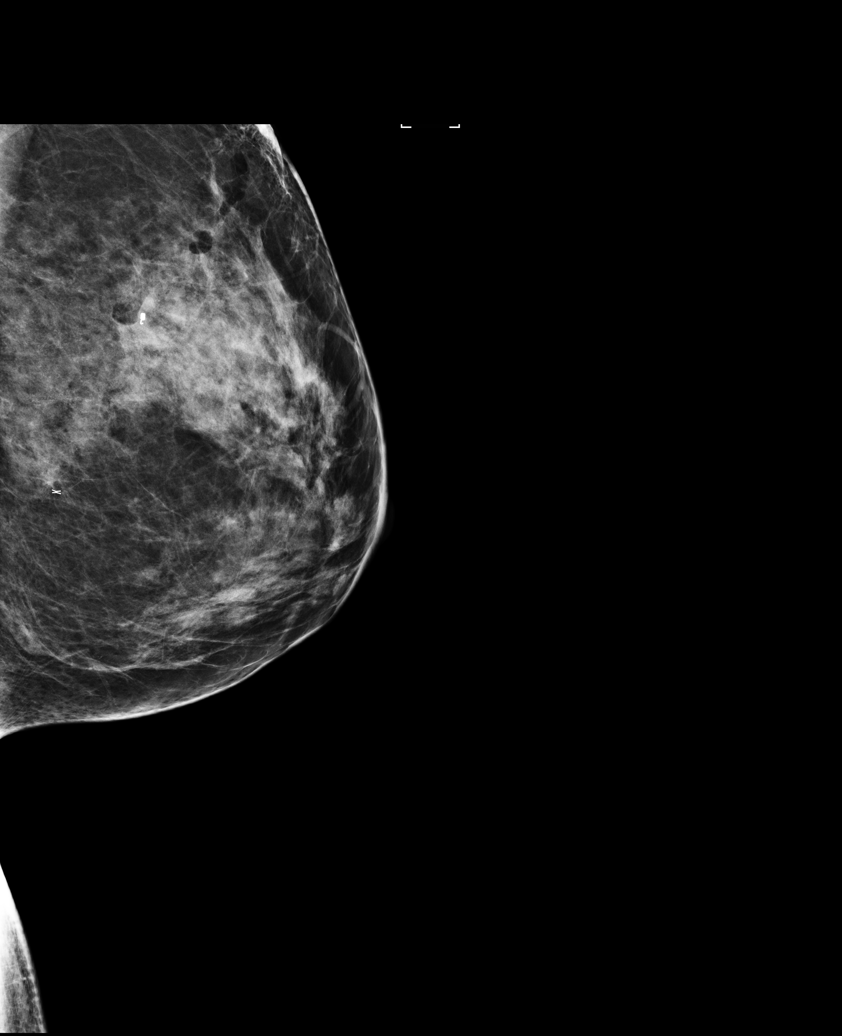

[L CC tomo · tomo slice 37/73.0]
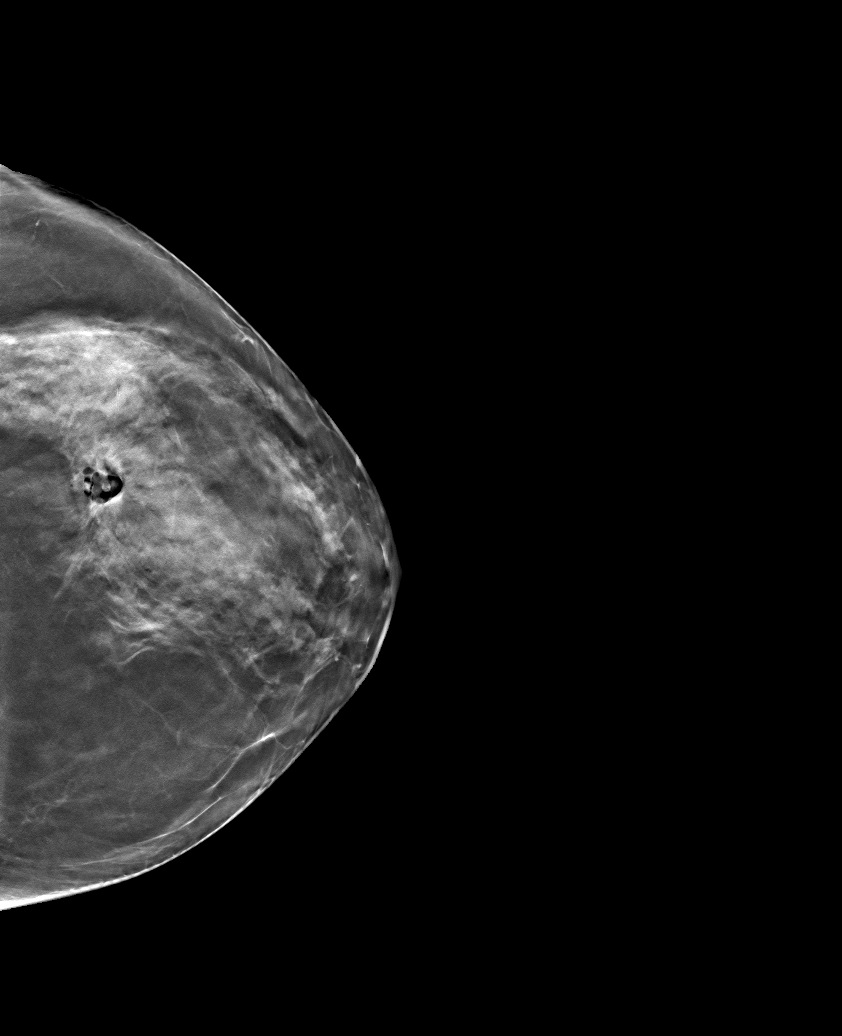

[L ML tomo · tomo slice 35/69.0]
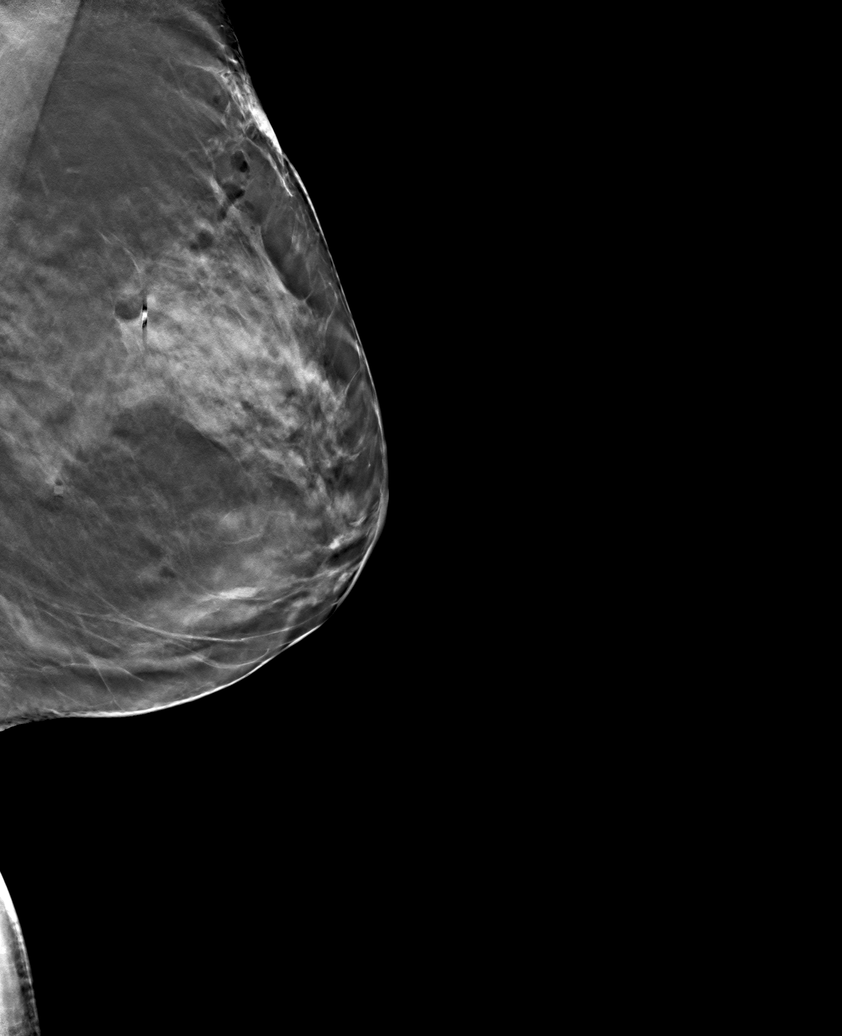

[6 of 14 positions shown; findings below may reference images not displayed]

FINDINGS: Mammographic images were obtained following stereotactic guided
biopsy of distortion within the slightly outer left breast, at
posterior depth, using a superior approach. At the conclusion of the
procedure, a coil shaped tissue marker was placed at the biopsy
site. This biopsy clip is well positioned at the site of the
targeted architectural distortion.
IMPRESSION: Postprocedure mammogram for clip placement. Coil shaped biopsy clip
is well positioned at the site of the targeted architectural
distortion in the slightly outer left breast, at posterior depth.

Final Assessment: Post Procedure Mammograms for Marker Placement

## 2017-04-08 IMAGING — MG BREAST SURGICAL SPECIMEN
1 series · 1 of 1 positions shown · non-contrast
Comparison: Previous exam(s).

CLINICAL DATA: Specimen radiograph status post excisional biopsy of
a complex sclerosing lesion.

EXAM:
SPECIMEN RADIOGRAPH OF THE LEFT BREAST

[L]
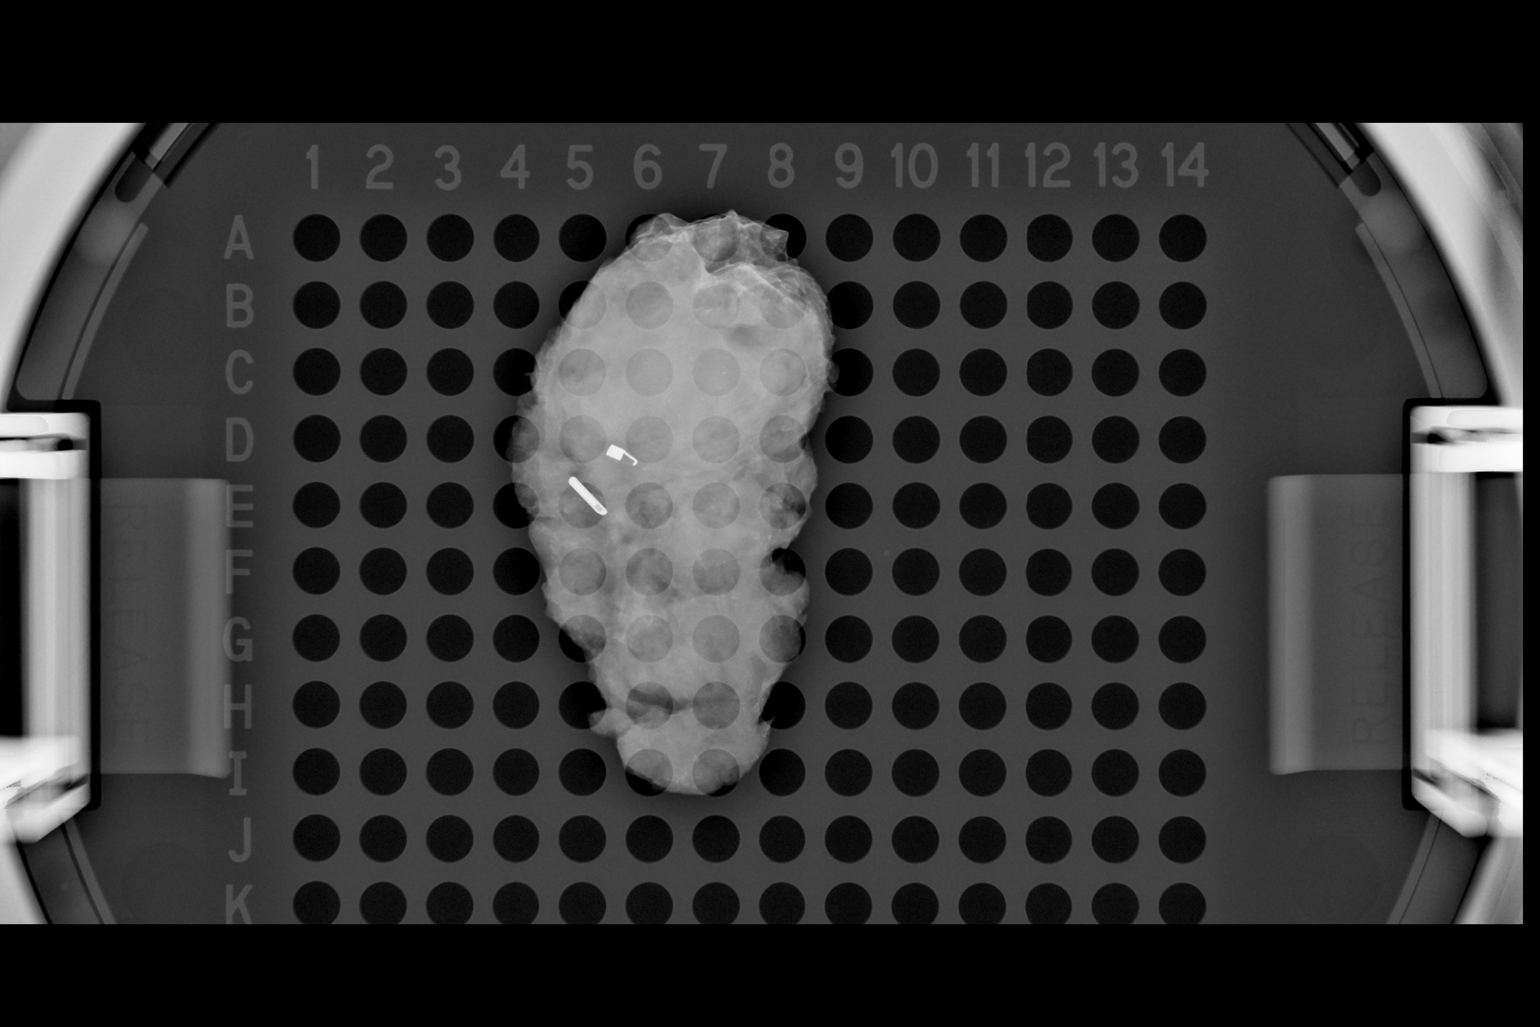

[1 of 1 positions shown; findings below may reference images not displayed]

FINDINGS: Status post excision of the left breast. The radioactive seed and
biopsy marker clip are present, completely intact, and were marked
for pathology. These findings were communicated with Jeantine in the OR
at [DATE] a.m..
IMPRESSION: Specimen radiograph of the left breast.

## 2017-12-29 ENCOUNTER — Encounter: Payer: Self-pay | Admitting: Emergency Medicine

## 2017-12-29 DIAGNOSIS — F40243 Fear of flying: Secondary | ICD-10-CM

## 2017-12-29 DIAGNOSIS — F5104 Psychophysiologic insomnia: Secondary | ICD-10-CM

## 2018-01-11 ENCOUNTER — Encounter: Payer: Self-pay | Admitting: Psychiatry

## 2018-01-11 ENCOUNTER — Ambulatory Visit (INDEPENDENT_AMBULATORY_CARE_PROVIDER_SITE_OTHER): Payer: 59 | Admitting: Psychiatry

## 2018-01-11 DIAGNOSIS — F40240 Claustrophobia: Secondary | ICD-10-CM | POA: Diagnosis not present

## 2018-01-11 DIAGNOSIS — F40243 Fear of flying: Secondary | ICD-10-CM | POA: Diagnosis not present

## 2018-01-11 DIAGNOSIS — F411 Generalized anxiety disorder: Secondary | ICD-10-CM

## 2018-01-11 DIAGNOSIS — F1321 Sedative, hypnotic or anxiolytic dependence, in remission: Secondary | ICD-10-CM

## 2018-01-11 DIAGNOSIS — F5105 Insomnia due to other mental disorder: Secondary | ICD-10-CM | POA: Diagnosis not present

## 2018-01-11 DIAGNOSIS — F409 Phobic anxiety disorder, unspecified: Secondary | ICD-10-CM

## 2018-01-11 NOTE — Progress Notes (Signed)
Joann Murphy [id number] Nov 02, 1955 62 y.o.  Subjective:   Patient ID:  Joann Murphy is a 62 y.o. (DOB [birth year]) female. 30 min appt. Chief Complaint:  Chief Complaint  Patient presents with  . Anxiety  . Sleeping Problem    HPI [name] presents to the office today for follow-up of anxiety and insomnia.  Last lorazepam was 9/22.  That has been fine without significan WD DT slow taper.  Tapered over a year.  Working 60 hours/week seasonally. Patient reports stable mood and denies depressed or irritable moods.  Patient denies any recent difficulty with anxiety.  Patient denies difficulty with sleep initiation or maintenance. Occ EFA. Denies appetite disturbance.  Patient reports that energy and motivation have been OK considering work.  Patient denies any difficulty with concentration.  Patient denies any suicidal ideation.  Wants to get off prazosin and propranolol.  Review of Systems:  Review of Systems  Musculoskeletal: Negative for gait problem.  Neurological: Negative for tremors and weakness.  Psychiatric/Behavioral:       Please refer to HPI    Medications: I have reviewed the patient's current medications.  Current Outpatient Medications  Medication Sig Dispense Refill  . levothyroxine (SYNTHROID, LEVOTHROID) 88 MCG tablet Take 88 mcg by mouth daily before breakfast.     . prazosin (MINIPRESS) 1 MG capsule Take 1 mg by mouth at bedtime.    . propranolol (INDERAL) 20 MG tablet Take 10 mg by mouth 2 (two) times daily.    Marland Kitchen HYDROcodone-acetaminophen (NORCO/VICODIN) 5-325 MG tablet Take 1-2 tablets by mouth every 4 (four) hours as needed for moderate pain or severe pain. (Patient not taking: Reported on 01/11/2018) 10 tablet 0  . omeprazole (PRILOSEC) 20 MG capsule Take 20 mg by mouth daily.     No current facility-administered medications for this visit.     Medication Side Effects: None  Allergies:  Allergies  Allergen Reactions  . Levofloxacin     Other  reaction(s): Other (See Comments) Muscle weakness  . Septra [Sulfamethoxazole-Trimethoprim] Rash    Past Medical History:  Diagnosis Date  . Anxiety   . Breast mass, left   . GERD (gastroesophageal reflux disease)   . Hypothyroidism     History reviewed. No pertinent family history.  Social History   Socioeconomic History  . Marital status: Married    Spouse name: Not on file  . Number of children: Not on file  . Years of education: Not on file  . Highest education level: Not on file  Occupational History  . Not on file  Social Needs  . Financial resource strain: Not on file  . Food insecurity:    Worry: Not on file    Inability: Not on file  . Transportation needs:    Medical: Not on file    Non-medical: Not on file  Tobacco Use  . Smoking status: Never Smoker  . Smokeless tobacco: Never Used  Substance and Sexual Activity  . Alcohol use: No  . Drug use: Not on file  . Sexual activity: Not on file  Lifestyle  . Physical activity:    Days per week: Not on file    Minutes per session: Not on file  . Stress: Not on file  Relationships  . Social connections:    Talks on phone: Not on file    Gets together: Not on file    Attends religious service: Not on file    Active member of club or organization: Not on file  Attends meetings of clubs or organizations: Not on file    Relationship status: Not on file  . Intimate partner violence:    Fear of current or ex partner: Not on file    Emotionally abused: Not on file    Physically abused: Not on file    Forced sexual activity: Not on file  Other Topics Concern  . Not on file  Social History Narrative  . Not on file    Past Medical History, Surgical history, Social history, and Family history were reviewed and updated as appropriate.   Please see review of systems for further details on the patient's review from today.   Objective:   Physical Exam:  There were no vitals taken for this visit.  Physical  Exam  Constitutional: She is oriented to person, place, and time. She appears well-developed. No distress.  Musculoskeletal: She exhibits no deformity.  Neurological: She is alert and oriented to person, place, and time. Coordination normal.  Psychiatric: She has a normal mood and affect. Her speech is normal and behavior is normal. Judgment and thought content normal. Her mood appears not anxious. Her affect is not angry, not blunt, not labile and not inappropriate. Cognition and memory are normal. She does not exhibit a depressed mood. She expresses no homicidal and no suicidal ideation. She expresses no suicidal plans and no homicidal plans.  Insight intact. No auditory or visual hallucinations. No delusions.     Lab Review:  No results found for: NA, K, CL, CO2, GLUCOSE, BUN, CREATININE, CALCIUM, PROT, ALBUMIN, AST, ALT, ALKPHOS, BILITOT, GFRNONAA, GFRAA  No results found for: WBC, RBC, HGB, HCT, PLT, MCV, MCH, MCHC, RDW, LYMPHSABS, MONOABS, EOSABS, BASOSABS  No results found for: POCLITH, LITHIUM   No results found for: PHENYTOIN, PHENOBARB, VALPROATE, CBMZ   .res Assessment: Plan:    Generalized anxiety disorder  Phobia, flying  Claustrophobia  Insomnia due to anxiety and fear  Benzodiazepine dependence in remission [facility])  Please see After Visit Summary for patient specific instructions.  Greater than 50% of face to face time with patient was spent on counseling and coordination of care. We discussed  Stop the propranolol first.  Wait 4-5 days and then stop prazosin.  Don't expect any problem.  Disc risk withdrawal or increasein anxiety or insomnia. Can use lorazepam for special circumstance, like going to mountains and flying.  Disc the strategy for that. And timing of dosage. Disc CBT for flying and mountain phobia.  FU prn  No future appointments.  No orders of the defined types were placed in this encounter.     -------------------------------

## 2018-01-17 ENCOUNTER — Other Ambulatory Visit: Payer: Self-pay

## 2018-01-17 MED ORDER — PRAZOSIN HCL 1 MG PO CAPS: 1.0000 mg | ORAL_CAPSULE | Freq: Every day | ORAL | 2 refills | Status: AC
